# Patient Record
Sex: Female | Born: 1995 | Race: White | Hispanic: No | Marital: Single | Smoking: Current every day smoker
Health system: Southern US, Community
[De-identification: ages and names within clinical notes are randomized; demographics above are authoritative.]

## PROBLEM LIST (undated history)

## (undated) DIAGNOSIS — J45909 Unspecified asthma, uncomplicated: Secondary | ICD-10-CM

---

## 2014-03-21 ENCOUNTER — Emergency Department: Payer: Self-pay | Admitting: Emergency Medicine

## 2014-08-15 ENCOUNTER — Emergency Department: Payer: Self-pay | Admitting: Emergency Medicine

## 2014-08-19 ENCOUNTER — Emergency Department: Payer: Self-pay | Admitting: Emergency Medicine

## 2014-11-23 NOTE — L&D Delivery Note (Signed)
Late entry due to computers being down for a long time.  Delivery Note At 4:10 PM a viable female was delivered via Vaginal, Spontaneous Delivery (Presentation: Right Occiput Anterior).  APGAR: 7, 9; weight 6 lb 5.6 oz (2880 g).   Placenta status: Intact, Spontaneous.  Cord: 3 vessels with the following complications: minimal wharton's jelly, very thin cord, wrapped around neck, body and arm.   Cord pH: not collected  Anesthesia: Epidural  Episiotomy:  none Lacerations:  None - bilateral labial abrasions Suture Repair: none Est. Blood Loss (mL):  400cc  Mom to postpartum.  Baby to Couplet care / Skin to Skin.  Variable decelerations throughout active first stage, also early decels.  Epidural replaced, Patient was complete and pushing, 2nd epidural not working.  She had controlled pushing, and delivered ROA with tight nuchal not able to be reduced, with body cord, and also wrapped around arm.  Baby was untangled and placed on mom's chest and had vigorous cry after stimulation.  First Apgar was 7 due to color and tone. Cord itself was thin and largely absent of wharton's jelly.  After at least 1 minute delay, the cord was clamped and cut by FOB.  Due to size (SGA) at dates (40.3) placenta was sent to pathology.  After Sheria Lang was recovered by the pediatric staff, he was placed on mom's chest skin to skin and we sang happy birthday!  Ward, Chelsea C 07/17/2015, 5:59 PM

## 2015-07-16 ENCOUNTER — Inpatient Hospital Stay: Payer: Medicaid Other

## 2015-07-16 ENCOUNTER — Encounter: Payer: Self-pay | Admitting: *Deleted

## 2015-07-16 ENCOUNTER — Inpatient Hospital Stay
Admission: RE | Admit: 2015-07-16 | Discharge: 2015-07-19 | DRG: 775 | Disposition: A | Discharge status: Home / Self Care | Payer: Medicaid Other | Attending: Obstetrics and Gynecology | Admitting: Obstetrics and Gynecology

## 2015-07-16 DIAGNOSIS — O26843 Uterine size-date discrepancy, third trimester: Secondary | ICD-10-CM

## 2015-07-16 DIAGNOSIS — O99333 Smoking (tobacco) complicating pregnancy, third trimester: Secondary | ICD-10-CM | POA: Diagnosis present

## 2015-07-16 DIAGNOSIS — F17200 Nicotine dependence, unspecified, uncomplicated: Secondary | ICD-10-CM | POA: Diagnosis present

## 2015-07-16 DIAGNOSIS — O365930 Maternal care for other known or suspected poor fetal growth, third trimester, not applicable or unspecified: Secondary | ICD-10-CM | POA: Diagnosis present

## 2015-07-16 DIAGNOSIS — Z3A40 40 weeks gestation of pregnancy: Secondary | ICD-10-CM | POA: Diagnosis present

## 2015-07-16 DIAGNOSIS — O368390 Maternal care for abnormalities of the fetal heart rate or rhythm, unspecified trimester, not applicable or unspecified: Secondary | ICD-10-CM | POA: Diagnosis present

## 2015-07-16 HISTORY — DX: Unspecified asthma, uncomplicated: J45.909

## 2015-07-16 LAB — CBC
HCT: 35.7 % (ref 35.0–47.0)
Hemoglobin: 12 g/dL (ref 12.0–16.0)
MCH: 31.8 pg (ref 26.0–34.0)
MCHC: 33.6 g/dL (ref 32.0–36.0)
MCV: 94.7 fL (ref 80.0–100.0)
PLATELETS: 173 10*3/uL (ref 150–440)
RBC: 3.77 MIL/uL — ABNORMAL LOW (ref 3.80–5.20)
RDW: 13.1 % (ref 11.5–14.5)
WBC: 10.6 10*3/uL (ref 3.6–11.0)

## 2015-07-16 LAB — URINE DRUG SCREEN, QUALITATIVE (ARMC ONLY)
AMPHETAMINES, UR SCREEN: NOT DETECTED
Barbiturates, Ur Screen: NOT DETECTED
Benzodiazepine, Ur Scrn: NOT DETECTED
CANNABINOID 50 NG, UR ~~LOC~~: NOT DETECTED
COCAINE METABOLITE, UR ~~LOC~~: NOT DETECTED
MDMA (ECSTASY) UR SCREEN: NOT DETECTED
Methadone Scn, Ur: NOT DETECTED
OPIATE, UR SCREEN: NOT DETECTED
PHENCYCLIDINE (PCP) UR S: NOT DETECTED
Tricyclic, Ur Screen: NOT DETECTED

## 2015-07-16 LAB — TYPE AND SCREEN
ABO/RH(D): AB POS
Antibody Screen: NEGATIVE

## 2015-07-16 MED ORDER — ACETAMINOPHEN 325 MG PO TABS: 650.0000 mg | ORAL_TABLET | ORAL | Status: DC | PRN

## 2015-07-16 MED ORDER — LIDOCAINE HCL (PF) 1 % IJ SOLN
30.0000 mL | INTRAMUSCULAR | Status: DC | PRN
  Filled 2015-07-16: qty 30

## 2015-07-16 MED ORDER — CITRIC ACID-SODIUM CITRATE 334-500 MG/5ML PO SOLN: 30.0000 mL | ORAL | Status: DC | PRN

## 2015-07-16 MED ORDER — OXYTOCIN BOLUS FROM INFUSION: 500.0000 mL | INTRAVENOUS | Status: DC

## 2015-07-16 MED ORDER — ONDANSETRON HCL 4 MG/2ML IJ SOLN: 4.0000 mg | Freq: Four times a day (QID) | INTRAMUSCULAR | Status: DC | PRN

## 2015-07-16 MED ORDER — LACTATED RINGERS IV SOLN
500.0000 mL | INTRAVENOUS | Status: DC | PRN
  Administered 2015-07-16: 500 mL via INTRAVENOUS

## 2015-07-16 MED ORDER — LACTATED RINGERS IV SOLN
INTRAVENOUS | Status: DC
  Administered 2015-07-17: 11:00:00 via INTRAVENOUS

## 2015-07-16 MED ORDER — FENTANYL CITRATE (PF) 100 MCG/2ML IJ SOLN: 50.0000 ug | INTRAMUSCULAR | Status: DC | PRN

## 2015-07-16 MED ORDER — OXYTOCIN 40 UNITS IN LACTATED RINGERS INFUSION - SIMPLE MED
62.5000 mL/h | INTRAVENOUS | Status: DC
  Administered 2015-07-17: 999 mL/h via INTRAVENOUS
  Filled 2015-07-16: qty 1000

## 2015-07-16 NOTE — OB Triage Note (Signed)
Pt arrived [facility] from [facility] for extended monitoring per Dr. Tiburcio Pea order.  Pt was seen today in office and had a decel during NST per MD. MD states pt was seen as new pt in practice as she has just moved from [place].  Pt is a G1 P0 with EDC of 07/14/15 making 40/2 weeks.  Pt denies any allergies. Pt denies any fallings during pregnancy.  Pt denies any bleeding or leaking of fluid. Ellison Carwin RNC

## 2015-07-16 NOTE — Progress Notes (Signed)
OB Note Unable to get the ultrasound report from her out of state clinic prior to them closing. 130 baseline, +accels, last decel (episode vs late) and variable was at 1840 and 1825 respectively, mod variability Toco: q3-40m NAD  Plan of care d/w pt. I told her that I don't want to induce her until we get her anatomy scan from her outside [place]. Also, we'll get a growth u/s tonight and possibly take her off EFM if the fetus is consistently category I for at least 4 hours and the growth u/s isn't concerning. Pt is amenable to plan  Cornelia Copa MD [facility]  Pager: [phone]

## 2015-07-16 NOTE — H&P (Addendum)
Obstetrics Admission History & Physical  07/16/2015 - 5:40 PM Primary OBGYN: [facility]  Chief Complaint: abnormal EFM in the office  History of Present Illness  19 y.o. G1 @ 40/2 (Dating: EDC 8/21 outside records), with the above CC. Pregnancy complicated by: asthma (no meds), tobacco abuse, BMI 25  Per patient and chart, last visit at her [place] O[facility] was two months and patient moved up her to be closer to family and b/c lack of room at her prior residence. Patient has had two visits with [place] with her first visit on 8/17.  In reviewing her records, it appears that her pregnancy is uncomplicated. She did have NIPT testing done, which was negative, and per the patient, this was likely done b/c they saw a "spot on the baby's heart on ultrasound."  No labor s/s or decreased FM, LOF.  Today, she got an AFI and NST in the office because she was new to the practice and AFI 7.5 cephalic and NST with a decel (NST note not in the system but there's no toco in the office anyways). She was checked and noted to 0/10/-3.  Review of Systems: her 12 point review of systems is negative or as noted in the History of Present Illness.  PMHx: No past medical history on file. PSHx: No past surgical history on file. Medications: PNV, iron  Allergies: has No Known Allergies. OBGYN Hx: as above         FHx: No family history on file. Soc Hx: FOB involved but still lives in [place]. They are on good terms but moved out b/c need for more physical space and they weren't living together at the time.  Social History   Social History  . Marital Status: Single    Spouse Name: N/A  . Number of Children: N/A  . Years of Education: N/A   Occupational History  . Not on file.   Social History Main Topics  . Smoking status: Not on file  . Smokeless tobacco: Not on file  . Alcohol Use: Not on file  . Drug Use: Not on file  . Sexual Activity: Not on file   Other Topics Concern  . Not on file   Social History Narrative   . No narrative on file    Objective  AF vital signs normal and stable  EFM: 130 baseline, no accels, occasional slight variables and one episodic decel x15m with nadir in the 100s Toco: no definite UCs but +irritability  General: Well nourished, well developed female in no acute distress.  Skin:  Warm and dry.  Cardiovascular: Regular rate and rhythm. Respiratory:  Clear to auscultation bilateral. Normal respiratory effort Abdomen: soft, NTTP, gravid, fundal height 32 Neuro/Psych:  Normal mood and affect.   Labs  pending  Radiology pending  Perinatal info  Per records: AB pos/RI/Varicella not checked/rpr and VDRL negative/hiv neg/hepB neg/tdap needed/pap not applicable/MSAFP, cystic fibrosis 32, sickle cell screening negative/1hr GTT 108  Assessment & Plan   19 y.o. G1 @ 40/2 with category II tracing and size < dates. Pt currently stable *IUP: category II tracing but normal baseline and moderate variability. Leave on continuous EFM -No u/s are in her records that we got from [facility]. I called their offices and they are going to fax [place] the report -tdap PP -check varicella PP *S<D: no fundal heights recorded at her two PNVs. Will get growth u/s.  *IOL: Given size < dates on exam and tracing, I recommended IOL to patient which she  is amenable to. Can proceed once is back. Will consider contraction stress test *?EIF: based on patient's history; will follow up u/s records from other OB provider *GBS: neg; GC/CT neg *Analgesia: no needs *Asthma: no needs  Cornelia Copa. MD [facility] Pager [phone]

## 2015-07-17 ENCOUNTER — Encounter: Payer: Self-pay | Admitting: Anesthesiology

## 2015-07-17 ENCOUNTER — Inpatient Hospital Stay: Payer: Medicaid Other | Admitting: Anesthesiology

## 2015-07-17 LAB — ABO/RH: ABO/RH(D): AB POS

## 2015-07-17 LAB — CHLAMYDIA/NGC RT PCR (ARMC ONLY)
CHLAMYDIA TR: NOT DETECTED
N GONORRHOEAE: NOT DETECTED

## 2015-07-17 LAB — RPR: RPR Ser Ql: NONREACTIVE

## 2015-07-17 MED ORDER — EPHEDRINE 5 MG/ML INJ
10.0000 mg | INTRAVENOUS | Status: DC | PRN
  Filled 2015-07-17: qty 2

## 2015-07-17 MED ORDER — BUPIVACAINE HCL (PF) 0.25 % IJ SOLN
INTRAMUSCULAR | Status: DC | PRN
  Administered 2015-07-17: 10 mL via PERINEURAL
  Administered 2015-07-17: 3 mL
  Administered 2015-07-17: 4 mL

## 2015-07-17 MED ORDER — PHENYLEPHRINE 40 MCG/ML (10ML) SYRINGE FOR IV PUSH (FOR BLOOD PRESSURE SUPPORT)
80.0000 ug | PREFILLED_SYRINGE | INTRAVENOUS | Status: DC | PRN
  Filled 2015-07-17: qty 2

## 2015-07-17 MED ORDER — LIDOCAINE-EPINEPHRINE (PF) 1.5 %-1:200000 IJ SOLN
INTRAMUSCULAR | Status: DC | PRN
  Administered 2015-07-17: 5 mL via PERINEURAL
  Administered 2015-07-17: 2 mL via PERINEURAL
  Administered 2015-07-17: 3 mL via PERINEURAL

## 2015-07-17 MED ORDER — DOCUSATE SODIUM 100 MG PO CAPS
100.0000 mg | ORAL_CAPSULE | Freq: Two times a day (BID) | ORAL | Status: DC
  Administered 2015-07-18 – 2015-07-19 (×4): 100 mg via ORAL
  Filled 2015-07-17 (×4): qty 1

## 2015-07-17 MED ORDER — PRENATAL MULTIVITAMIN CH
1.0000 | ORAL_TABLET | Freq: Every day | ORAL | Status: DC
  Administered 2015-07-18 – 2015-07-19 (×2): 1 via ORAL
  Filled 2015-07-17 (×2): qty 1

## 2015-07-17 MED ORDER — FENTANYL 2.5 MCG/ML W/ROPIVACAINE 0.2% IN NS 100 ML EPIDURAL INFUSION (ARMC-ANES): 10.0000 mL/h | EPIDURAL | Status: DC

## 2015-07-17 MED ORDER — ONDANSETRON HCL 4 MG/2ML IJ SOLN: 4.0000 mg | INTRAMUSCULAR | Status: DC | PRN

## 2015-07-17 MED ORDER — BENZOCAINE-MENTHOL 20-0.5 % EX AERO: 1.0000 "application " | INHALATION_SPRAY | CUTANEOUS | Status: DC | PRN

## 2015-07-17 MED ORDER — LIDOCAINE HCL (PF) 1 % IJ SOLN
INTRAMUSCULAR | Status: DC | PRN
  Administered 2015-07-17: 3 mL

## 2015-07-17 MED ORDER — IBUPROFEN 600 MG PO TABS
600.0000 mg | ORAL_TABLET | Freq: Four times a day (QID) | ORAL | Status: DC
  Administered 2015-07-17 – 2015-07-19 (×7): 600 mg via ORAL
  Filled 2015-07-17 (×6): qty 1

## 2015-07-17 MED ORDER — TETANUS-DIPHTH-ACELL PERTUSSIS 5-2.5-18.5 LF-MCG/0.5 IM SUSP
0.5000 mL | Freq: Once | INTRAMUSCULAR | Status: AC
  Administered 2015-07-19: 0.5 mL via INTRAMUSCULAR
  Filled 2015-07-17: qty 0.5

## 2015-07-17 MED ORDER — IBUPROFEN 600 MG PO TABS
ORAL_TABLET | ORAL | Status: AC
  Administered 2015-07-17: 600 mg via ORAL
  Filled 2015-07-17: qty 1

## 2015-07-17 MED ORDER — FENTANYL 2.5 MCG/ML W/ROPIVACAINE 0.2% IN NS 100 ML EPIDURAL INFUSION (ARMC-ANES)
EPIDURAL | Status: AC
  Administered 2015-07-17: 9 mL/h via EPIDURAL
  Filled 2015-07-17: qty 100

## 2015-07-17 MED ORDER — DIPHENHYDRAMINE HCL 50 MG/ML IJ SOLN: 12.5000 mg | INTRAMUSCULAR | Status: DC | PRN

## 2015-07-17 MED ORDER — WITCH HAZEL-GLYCERIN EX PADS: 1.0000 "application " | MEDICATED_PAD | CUTANEOUS | Status: DC | PRN

## 2015-07-17 MED ORDER — SIMETHICONE 80 MG PO CHEW: 80.0000 mg | CHEWABLE_TABLET | ORAL | Status: DC | PRN

## 2015-07-17 MED ORDER — ONDANSETRON HCL 4 MG PO TABS
4.0000 mg | ORAL_TABLET | ORAL | Status: DC | PRN
Start: 2015-07-17 — End: 2015-07-19

## 2015-07-17 MED ORDER — ACETAMINOPHEN 325 MG PO TABS
650.0000 mg | ORAL_TABLET | ORAL | Status: DC | PRN
  Administered 2015-07-17 – 2015-07-19 (×3): 650 mg via ORAL
  Filled 2015-07-17 (×3): qty 2

## 2015-07-17 MED ORDER — DIPHENHYDRAMINE HCL 25 MG PO CAPS: 25.0000 mg | ORAL_CAPSULE | Freq: Four times a day (QID) | ORAL | Status: DC | PRN

## 2015-07-17 MED ORDER — DIBUCAINE 1 % RE OINT: 1.0000 "application " | TOPICAL_OINTMENT | RECTAL | Status: DC | PRN

## 2015-07-17 MED ORDER — LANOLIN HYDROUS EX OINT: TOPICAL_OINTMENT | CUTANEOUS | Status: DC | PRN

## 2015-07-17 NOTE — Anesthesia Procedure Notes (Addendum)
Epidural Patient location during procedure: OB Start time: 07/17/2015 11:26 AM End time: 07/17/2015 11:46 AM  Staffing Resident/CRNA: Amoni Morales  Preanesthetic Checklist Completed: patient identified, site marked, surgical consent, pre-op evaluation, timeout performed, IV checked, risks and benefits discussed and monitors and equipment checked  Epidural Patient position: sitting Prep: Betadine Patient monitoring: heart rate, continuous pulse ox and blood pressure Approach: midline Location: L4-L5 Injection technique: LOR saline  Needle:  Needle type: Tuohy  Needle gauge: 18 G Needle length: 9 cm and 9 Needle insertion depth: 6 cm Catheter type: closed end flexible Catheter size: 20 Guage Catheter at skin depth: 11 cm Test dose: negative and 1.5% lidocaine with Epi 1:200 K  Assessment Sensory level: T9 Events: blood not aspirated, injection not painful, no injection resistance, negative IV test and no paresthesia  Additional Notes   Patient tolerated the insertion well without complications.Reason for block:procedure for pain  Epidural Patient location during procedure: OB Start time: 07/17/2015 1:49 PM End time: 07/17/2015 2:08 PM  Staffing Resident/CRNA: Junious Silk Performed by: resident/CRNA   Preanesthetic Checklist Completed: patient identified, site marked, surgical consent, pre-op evaluation, timeout performed, IV checked, risks and benefits discussed and monitors and equipment checked  Epidural Patient position: sitting Prep: Betadine Patient monitoring: heart rate, continuous pulse ox and blood pressure Approach: midline Location: L2-L3 Injection technique: LOR saline  Needle:  Needle type: Tuohy  Needle gauge: 18 G Needle length: 9 cm and 9 Needle insertion depth: 6 cm Catheter type: closed end flexible Catheter size: 20 Guage Catheter at skin depth: 11 cm Test dose: negative and 1.5% lidocaine with Epi 1:200 K  Assessment Sensory level:  T10 Events: blood not aspirated, injection not painful, no injection resistance, negative IV test and no paresthesia  Additional Notes  1st epidural failed.  Removed with tip intact.  Placed new epi in.   Patient tolerated the insertion well without complications.Reason for block:procedure for pain

## 2015-07-17 NOTE — Anesthesia Preprocedure Evaluation (Addendum)
Anesthesia Evaluation  Patient identified by MRN, date of birth, ID band Patient awake    Reviewed: Allergy & Precautions, H&P , NPO status , Patient's Chart, lab work & pertinent test results, reviewed documented beta blocker date and time   Airway Mallampati: II  TM Distance: <3 FB Neck ROM: full    Dental no notable dental hx.    Pulmonary neg pulmonary ROS, asthma , Current Smoker,    Pulmonary exam normal       Cardiovascular negative cardio ROS Normal cardiovascular exam    Neuro/Psych negative neurological ROS  negative psych ROS   GI/Hepatic negative GI ROS, Neg liver ROS,   Endo/Other  negative endocrine ROS  Renal/GU negative Renal ROS  negative genitourinary   Musculoskeletal   Abdominal   Peds  Hematology negative hematology ROS (+)   Anesthesia Other Findings   Reproductive/Obstetrics (+) Pregnancy                            Anesthesia Physical Anesthesia Plan  ASA: II  Anesthesia Plan: Epidural   Post-op Pain Management:    Induction:   Airway Management Planned:   Additional Equipment:   Intra-op Plan:   Post-operative Plan:   Informed Consent: I have reviewed the patients History and Physical, chart, labs and discussed the procedure including the risks, benefits and alternatives for the proposed anesthesia with the patient or authorized representative who has indicated his/her understanding and acceptance.     Plan Discussed with: Anesthesiologist and CRNA  Anesthesia Plan Comments:        Anesthesia Quick Evaluation

## 2015-07-17 NOTE — Progress Notes (Signed)
[id number], labor, delivery and postpartum care entered late due to EPIC down time

## 2015-07-17 NOTE — Discharge Summary (Signed)
Obstetrical Discharge Summary  Date of Admission: 07/16/2015 Date of Discharge: 07/19/2015  Primary OB: in [place], NOB @ [facility] @ [redacted]wks  Gestational Age at Delivery: [redacted]w[redacted]d   Antepartum complications: late transfer of care, size < dates Reason for Admission:  Category 2 strip on NST in-office, for prolonged monitoring, decelerations. Date of Delivery:  07/17/15 Delivered By: Leeroy Bock Ward Delivery Type: spontaneous vaginal delivery Intrapartum complications/course: Patient was admitted to triage for prolonged monitoring due to decelerations seen on in-office NST.  She was observed, with occasional variables, and was measuring S<D on [place].  Ultrasound was obtained and AUA was 37 weeks, with extrapolation of EFW to be ~10th% of EGA of 40w 3days.  (~2800g).  FHT showed some decelerations, and upon AM cervical exam, was found to be 4cm.  She was AROM'd and expectantly managed.  Variable decelerations throughout active first stage, also early decels.She had controlled pushing, and delivered ROA with tight nuchal not able to be reduced, with body cord, and also wrapped around arm. Baby was untangled and placed on mom's chest and had vigorous cry after stimulation. First Apgar was 7 due to color and tone. Cord itself was thin and largely absent of wharton's jelly. After at least 1 minute delay, the cord was clamped and cut by FOB. Due to SGA, placenta was sent to pathology. Anesthesia: epidural (x2, neither worked well) Placenta: sponatneous Laceration: none, bilateral labial abrasions, no repair Episiotomy: none Newborn Data: Live born female  Birth Weight: 6 lb 5.6 oz (2880 g) APGAR: 7, 9    Discharge Physical Exam:  General: NAD CV: RRR Pulm: CTABL, nl effort ABD: s/nd/nt, fundus firm and below the umbilicus Lochia: moderate DVT Evaluation: LE non-ttp, no evidence of DVT on exam.  HEMOGLOBIN  Date Value Ref Range Status  07/18/2015 11.2* 12.0 - 16.0 g/dL Final   HCT  Date Value  Ref Range Status  07/18/2015 33.0* 35.0 - 47.0 % Final    Post partum course: routine Postpartum Procedures: none Disposition: stable, discharge to home.  Rh Immune globulin given: no Rubella vaccine given: no Tdap vaccine given in AP or PP setting: postpartum Flu vaccine given in AP or PP setting: n/a  Contraception: OCPs, start in 3-4 weeks; Rx given  Prenatal Labs:  AB pos/RI/Varicella not checked/rpr and VDRL negative/hiv neg/hepB neg/tdap needed/pap not applicable/MSAFP, cystic fibrosis 32, sickle cell screening negative/1hr GTT 108, GBS neg   Plan:  Lindsay Williamson was discharged to home in good condition. Follow-up appointment at [facility] with Dr Elesa Massed in 6 weeks   Discharge Medications:   Medication List    TAKE these medications        ferrous sulfate 325 (65 FE) MG tablet  Take 325 mg by mouth daily with breakfast.     ibuprofen 600 MG tablet  Commonly known as:  ADVIL,MOTRIN  Take 1 tablet (600 mg total) by mouth every 6 (six) hours.     PRENATAL VITAMIN PO  Take 1 tablet by mouth daily.        Signed: Annamarie Major, MD [facility] [phone]

## 2015-07-18 LAB — CBC
HCT: 33 % — ABNORMAL LOW (ref 35.0–47.0)
Hemoglobin: 11.2 g/dL — ABNORMAL LOW (ref 12.0–16.0)
MCH: 32.3 pg (ref 26.0–34.0)
MCHC: 34 g/dL (ref 32.0–36.0)
MCV: 95.2 fL (ref 80.0–100.0)
PLATELETS: 149 10*3/uL — AB (ref 150–440)
RBC: 3.46 MIL/uL — ABNORMAL LOW (ref 3.80–5.20)
RDW: 13.1 % (ref 11.5–14.5)
WBC: 15.7 10*3/uL — AB (ref 3.6–11.0)

## 2015-07-18 NOTE — Anesthesia Postprocedure Evaluation (Signed)
  Anesthesia Post-op Note  Patient: Lindsay Williamson  Procedure(s) Performed: CLE  Anesthesia type:Epidural  Patient location: 337  Post pain: Pain level controlled  Post assessment: Post-op Vital signs reviewed, Patient's Cardiovascular Status Stable, Respiratory Function Stable, Patent Airway and No signs of Nausea or vomiting  Post vital signs: Reviewed and stable  Last Vitals:  Filed Vitals:   07/18/15 0747  BP: 112/71  Pulse: 62  Temp: 36.6 C  Resp: 20    Level of consciousness: awake, alert  and patient cooperative  Complications: No apparent anesthesia complications

## 2015-07-18 NOTE — Progress Notes (Signed)
Post Partum [id number] Subjective: no complaints and tolerating PO, bottle feeding, mild cramping  Objective: Blood pressure 107/70, pulse 73, temperature 98 F (36.7 C), temperature source Oral, resp. rate 18, height  (1.626 m), weight 142 lb (64.411 kg), last menstrual period 09/27/2014, SpO2 99 %, unknown if currently breastfeeding.  Physical Exam:  General: alert, cooperative and happy Lochia: appropriate Uterine Fundus: firm at U-1/ ML/ NT DVT Evaluation: No evidence of DVT seen on physical exam.   Recent Labs  07/16/15 1737 07/18/15 0539  HGB 12.0 11.2*  HCT 35.7 33.0*  WBC 10.6 15.7*  PLT 173 149*    Assessment/Plan:  PPD #1 stable Plan for discharge tomorrow   LOS: 2 days   Lindsay Williamson 07/18/2015, 12:33 PM

## 2015-07-19 LAB — SURGICAL PATHOLOGY

## 2015-07-19 MED ORDER — IBUPROFEN 600 MG PO TABS: 600.0000 mg | ORAL_TABLET | Freq: Four times a day (QID) | ORAL | Status: AC

## 2015-07-19 NOTE — Discharge Instructions (Signed)
Discharge instructions:   Call office if you have any of the following: headache, visual changes, fever >100 F, chills, breast concerns, excessive vaginal bleeding, leg pain or redness, depression or any other concerns.   Activity: Do not lift > 10 lbs for 6 weeks.  No intercourse or tampons for 6 weeks.  No driving for 1-2 weeks.   Call your doctor for increased pain or vaginal bleeding, temperature above 100.4, depression, or concerns.  No strenuous activity or heavy lifting for 6 weeks.  No intercourse, tampons, douching, or enemas for 6 weeks.  No tub baths-showers only.  No driving for 2 weeks or while taking pain medications.  Continue prenatal vitamin and iron.  Increase calories and fluids while breastfeeding.  Please call your doctor or return to the ER if you experience any chest pains, shortness of breath, fever greater than 101, any heavy bleeding or large clots, and foul smelling vaginal discharge, any worsening abdominal pain & cramping that is not controlled by pain medication, or any signs of post partum depression.  No tampons, enemas, douches, or sexual intercourse for 6 weeks.  Also avoid tub baths, hot tubs, or swimming for 6 weeks.

## 2015-07-19 NOTE — Progress Notes (Signed)
Post Partum Day 2 Subjective: no complaints and tolerating PO, bottle feeding Objective: Blood pressure 108/67, pulse 72, temperature 98.9 F (37.2 C), temperature source Oral, resp. rate 18, height  (1.626 m), weight 64.411 kg (142 lb), last menstrual period 09/27/2014, SpO2 97 %, unknown if currently breastfeeding.  Physical Exam:  General: alert, cooperative and happy Lochia: appropriate Uterine Fundus: firm at U-1/ ML/ NT DVT Evaluation: No evidence of DVT seen on physical exam.   Recent Labs  07/16/15 1737 07/18/15 0539  HGB 12.0 11.2*  HCT 35.7 33.0*  WBC 10.6 15.7*  PLT 173 149*    Assessment/Plan:  PPD #2 stable Plan for discharge OCPs discussed TDaP PNV   LOS: 3 days   Makyah Lavigne PAUL 07/19/2015, 8:12 AM

## 2016-02-26 ENCOUNTER — Encounter: Payer: Self-pay | Admitting: *Deleted

## 2016-02-26 ENCOUNTER — Emergency Department
Admission: EM | Admit: 2016-02-26 | Discharge: 2016-02-26 | Disposition: A | Discharge status: Home / Self Care | Payer: Medicaid Other | Attending: Emergency Medicine | Admitting: Emergency Medicine

## 2016-02-26 DIAGNOSIS — Z791 Long term (current) use of non-steroidal anti-inflammatories (NSAID): Secondary | ICD-10-CM | POA: Insufficient documentation

## 2016-02-26 DIAGNOSIS — J029 Acute pharyngitis, unspecified: Secondary | ICD-10-CM | POA: Insufficient documentation

## 2016-02-26 DIAGNOSIS — F17210 Nicotine dependence, cigarettes, uncomplicated: Secondary | ICD-10-CM | POA: Insufficient documentation

## 2016-02-26 DIAGNOSIS — J45909 Unspecified asthma, uncomplicated: Secondary | ICD-10-CM | POA: Insufficient documentation

## 2016-02-26 LAB — POCT RAPID STREP A: STREPTOCOCCUS, GROUP A SCREEN (DIRECT): NEGATIVE

## 2016-02-26 MED ORDER — LIDOCAINE VISCOUS 2 % MT SOLN: 20.0000 mL | OROMUCOSAL | Status: AC | PRN

## 2016-02-26 MED ORDER — AMOXICILLIN 500 MG PO TABS: 500.0000 mg | ORAL_TABLET | Freq: Two times a day (BID) | ORAL | Status: AC

## 2016-02-26 MED ORDER — GUAIFENESIN-CODEINE 100-10 MG/5ML PO SOLN: 10.0000 mL | ORAL | Status: AC | PRN

## 2016-02-26 NOTE — ED Provider Notes (Signed)
[facility] Emergency Department Provider Note  ____________________________________________  Time seen: Approximately 7:08 AM  I have reviewed the triage vital signs and the nursing notes.   HISTORY  Chief Complaint Sore Throat    HPI Lindsay Williamson is a 20 y.o. female presents for sore throat times one week. Denies any cough. Positive for cigarette smoker. Has tried Over-the-counter medications with no relief. Describes her discomfort as 9/10. Currently works in a [employer].Denies any fever chills.    Past Medical History  Diagnosis Date  . Asthma     last used inhaler a year ago    Patient Active Problem List   Diagnosis Date Noted  . Abnormal fetal heart rate affecting pregnancy 07/16/2015    No past surgical history on file.  Current Outpatient Rx  Name  Route  Sig  Dispense  Refill  . amoxicillin (AMOXIL) 500 MG tablet   Oral   Take 1 tablet (500 mg total) by mouth 2 (two) times daily.   20 tablet   0   . ferrous sulfate 325 (65 FE) MG tablet   Oral   Take 325 mg by mouth daily with breakfast.         . guaiFENesin-codeine 100-10 MG/5ML syrup   Oral   Take 10 mLs by mouth every 4 (four) hours as needed for cough.   120 mL   0   . ibuprofen (ADVIL,MOTRIN) 600 MG tablet   Oral   Take 1 tablet (600 mg total) by mouth every 6 (six) hours.   50 tablet   0   . lidocaine (XYLOCAINE) 2 % solution   Mouth/Throat   Use as directed 20 mLs in the mouth or throat as needed for mouth pain.   100 mL   0     Allergies Review of patient's allergies indicates no known allergies.  No family history on file.  Social History Social History  Substance Use Topics  . Smoking status: Current Every Day Smoker -- 0.25 packs/day for 3 years    Types: Cigarettes  . Smokeless tobacco: None  . Alcohol Use: No    Review of Systems Constitutional: No fever/chills ENT: Positive sore throat. Cardiovascular: Denies chest  pain. Respiratory: Denies shortness of breath. Gastrointestinal: No abdominal pain.  No nausea, no vomiting.  No diarrhea.  No constipation. Genitourinary: Negative for dysuria. Musculoskeletal: Negative for back pain. Skin: Negative for rash. Neurological: Negative for headaches, focal weakness or numbness.  10-point ROS otherwise negative.  ____________________________________________   PHYSICAL EXAM:  VITAL SIGNS: ED Triage Vitals  Enc Vitals Group     BP 02/26/16 0217 105/61 mmHg     Pulse Rate 02/26/16 0217 83     Resp 02/26/16 0217 20     Temp --      Temp src --      SpO2 02/26/16 0217 98 %     Weight 02/26/16 0217 120 lb (54.432 kg)     Height 02/26/16 0217  (1.575 m)     Head Cir --      Peak Flow --      Pain Score 02/26/16 0220 9     Pain Loc --      Pain Edu? --      Excl. in GC? --     Constitutional: Alert and oriented. Well appearing and in no acute distress. Head: Atraumatic. Nose: No congestion/rhinnorhea. Mouth/Throat: Mucous membranes are moist.  Oropharynx erythematous. Neck: No stridor. No cervical adenopathy  full range of motion.   Cardiovascular: Normal rate, regular rhythm. Grossly normal heart sounds.  Good peripheral circulation. Respiratory: Normal respiratory effort.  No retractions. Lungs CTAB. Neurologic:  Normal speech and language. No gross focal neurologic deficits are appreciated. No gait instability. Skin:  Skin is warm, dry and intact. No rash noted. Psychiatric: Mood and affect are normal. Speech and behavior are normal.  ____________________________________________   LABS (all labs ordered are listed, but only abnormal results are displayed)  Labs Reviewed  CULTURE, GROUP A STREP [facility]  POCT RAPID STREP A     PROCEDURES  Procedure(s) performed: None  Critical Care performed: No  ____________________________________________   INITIAL IMPRESSION / ASSESSMENT AND PLAN / ED COURSE  Pertinent labs & imaging  results that were available during my care of the patient were reviewed by me and considered in my medical decision making (see chart for details).  Acute tonsillar pharyngitis. Rx given for amoxicillin 500 mg twice a day 10 days, viscous lidocaine. Patient follow-up with PCP or return to the ER with any worsening symptomology. In addition patient was prescription was given a LawyerTessalon Perles for cough. ____________________________________________   FINAL CLINICAL IMPRESSION(S) / ED DIAGNOSES  Final diagnoses:  Acute pharyngitis, unspecified pharyngitis type     This chart was dictated using voice recognition software/Dragon. Despite best efforts to proofread, errors can occur which can change the meaning. Any change was purely unintentional.   Evangeline Dakinharles M Beers, PA-C 02/26/16 [phone]

## 2016-02-26 NOTE — ED Notes (Signed)
Pt has a sore throat for 1 week.  No earache   No cough.  cig smoker.  Pt alert.

## 2016-02-26 NOTE — Discharge Instructions (Signed)

## 2016-02-28 LAB — CULTURE, GROUP A STREP (THRC)

## 2016-06-04 IMAGING — CR DG CHEST 2V
1 series · 2 of 2 positions shown · non-contrast
Comparison: None.

CLINICAL DATA: Cough and wheezing

EXAM:
CHEST  2 VIEW

[Series 1: w chest pa · 0.14mm/px · 2 of 2 slices shown]
[im 1/2]
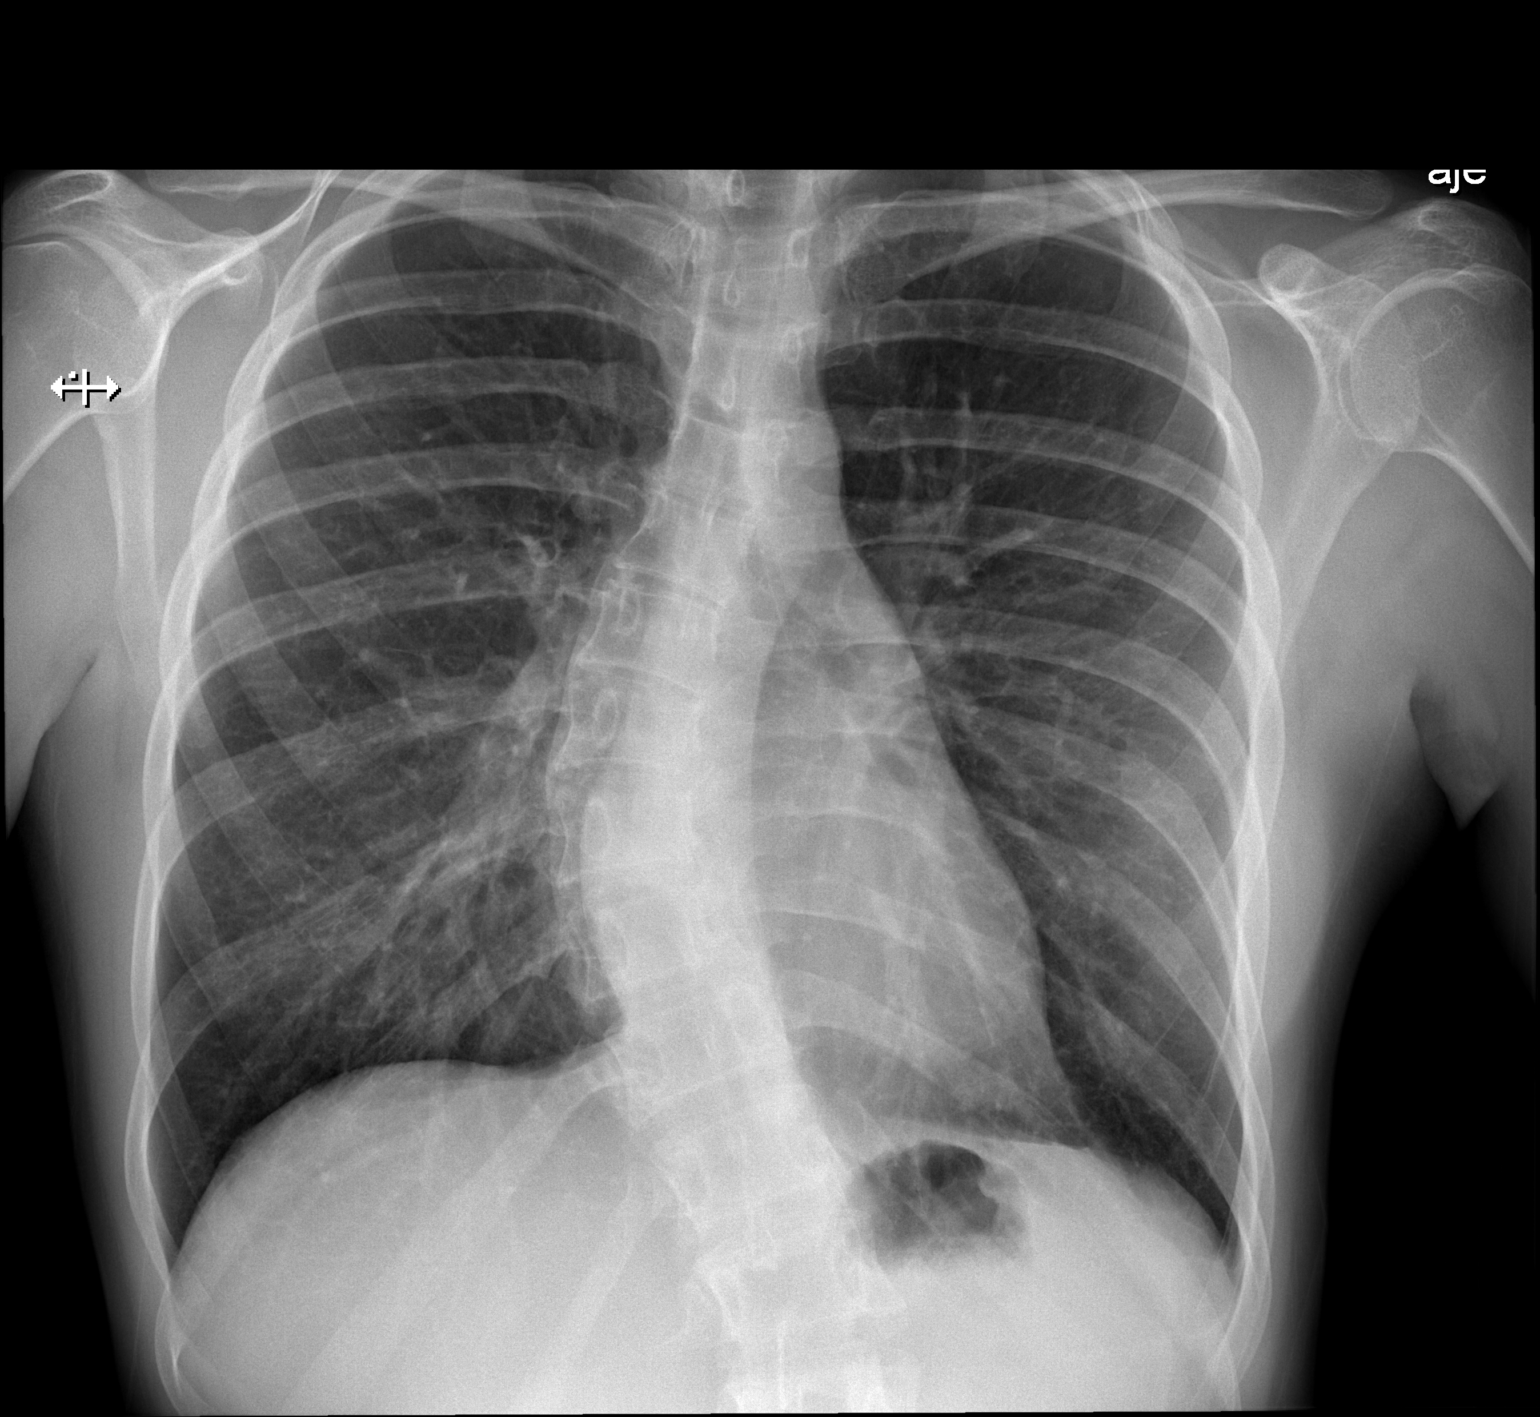
[im 2/2]
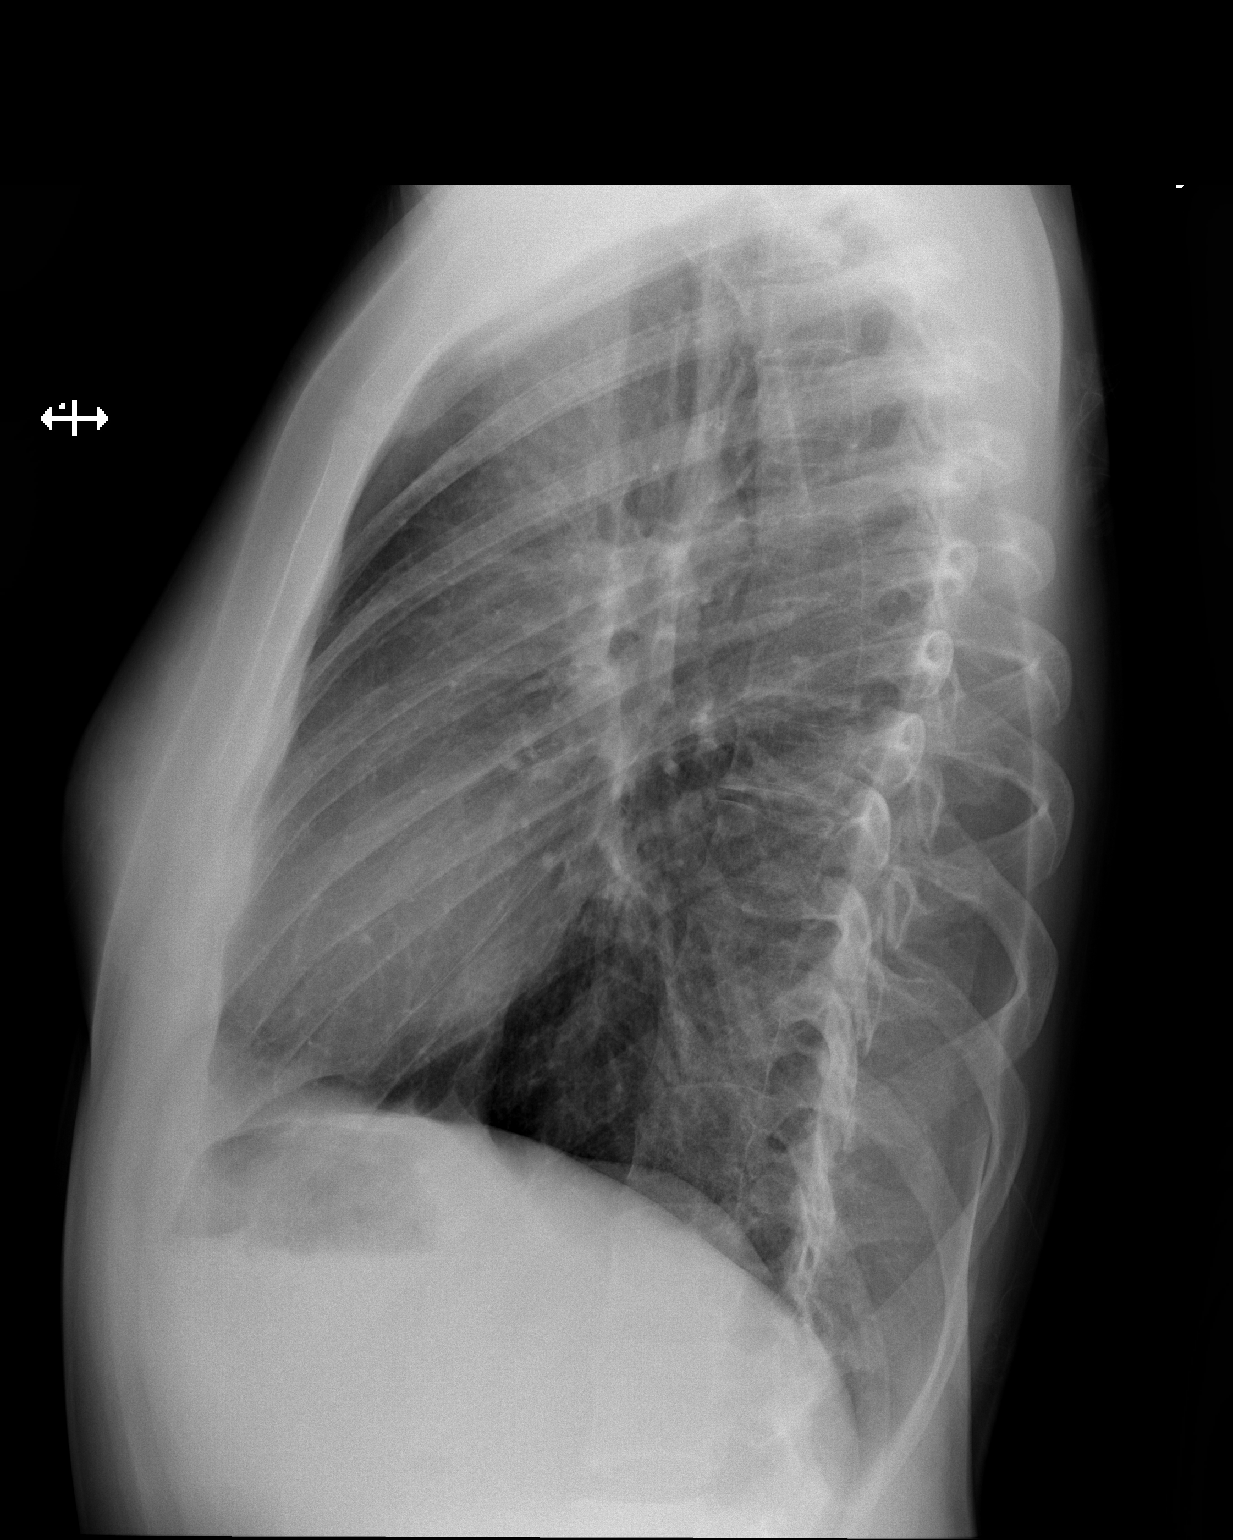

[2 of 2 positions shown; findings below may reference images not displayed]

FINDINGS: Lungs are clear. Heart size and pulmonary vascularity are normal. No
adenopathy. No pneumothorax. There is mid thoracic dextroscoliosis
with thoracolumbar levoscoliosis.
IMPRESSION: No edema or consolidation.  Scoliosis.

## 2017-03-24 IMAGING — US US OB COMP +14 WK
1 series · 13 of 28 positions shown · non-contrast
Comparison: none

CLINICAL DATA: Size less than dates, assess fetal growth. Outside
prior exams in [place], not available for comparison.

EXAM:
OBSTETRIC 14+ WK ULTRASOUND

[Series 1: us ob comp +14 wk · 0.21mm/px · 47 acquisitions, 13 frames shown]
[im 2/47]
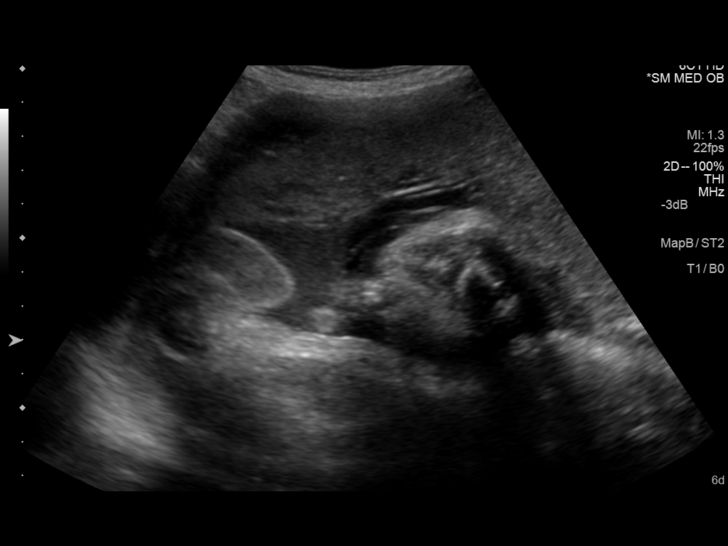
[im 6/47]
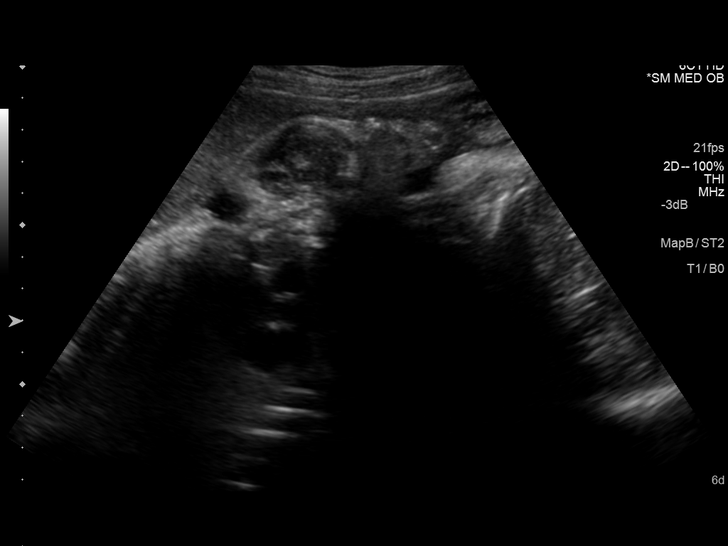
[im 9/47]
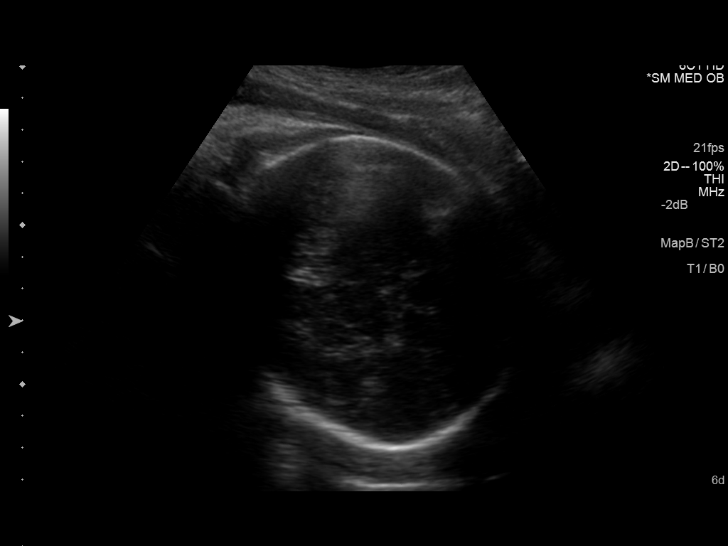
[im 12/47]
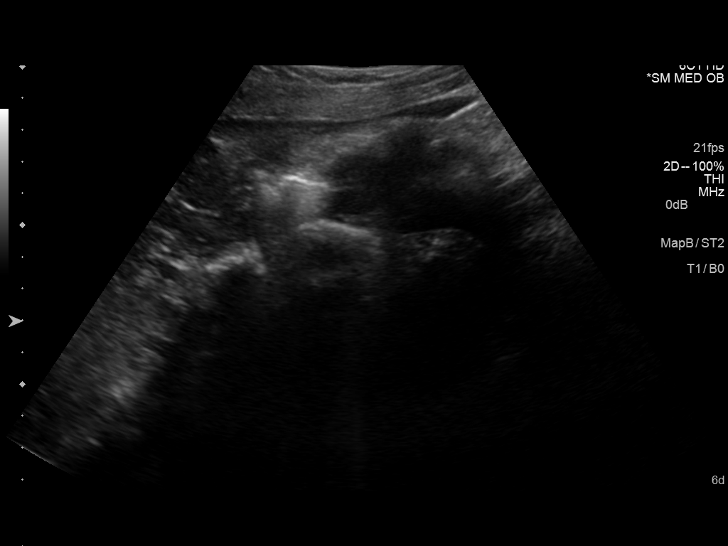
[im 16/47]
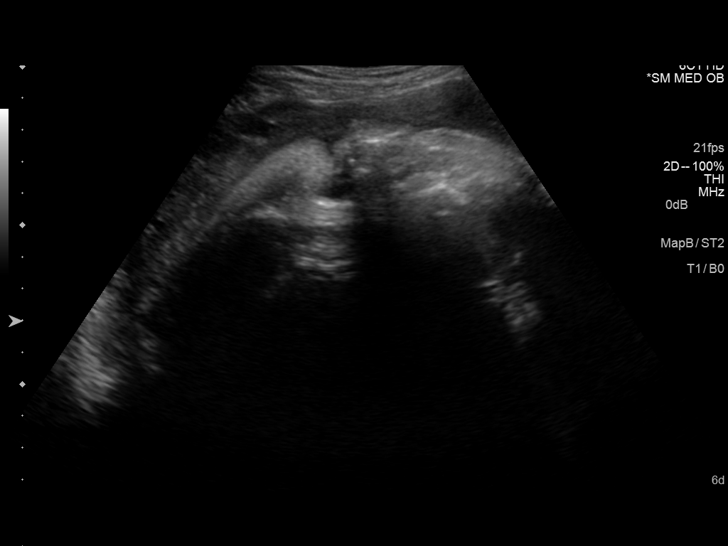
[im 19/47]
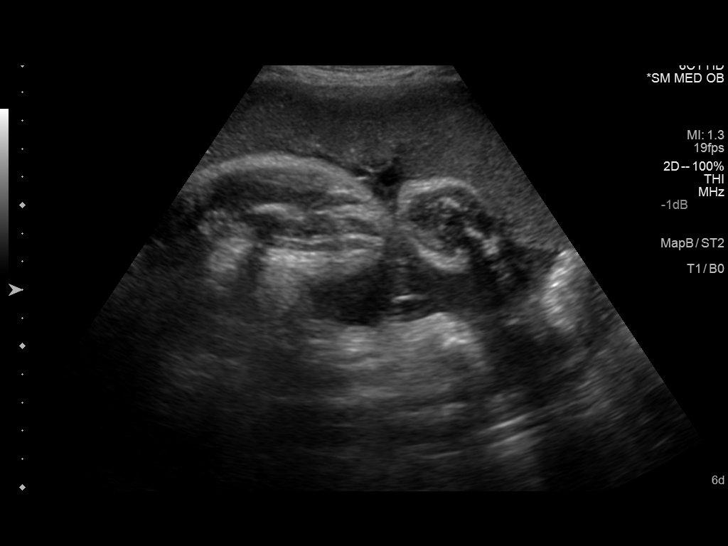
[im 24/47]
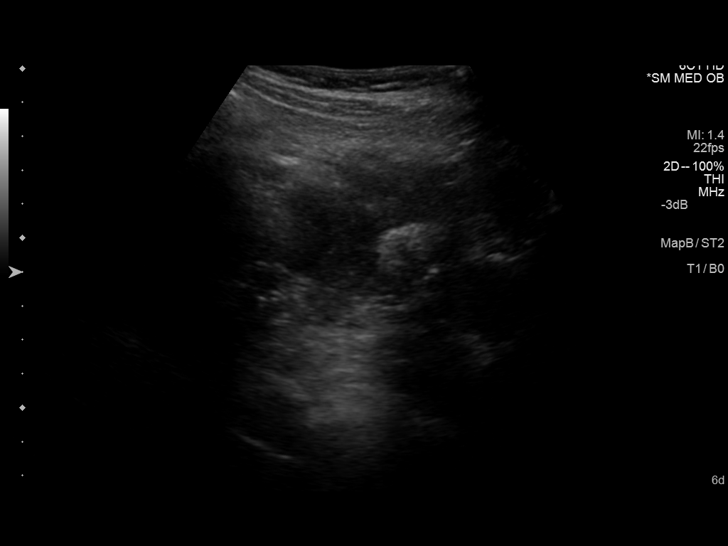
[im 28/47]
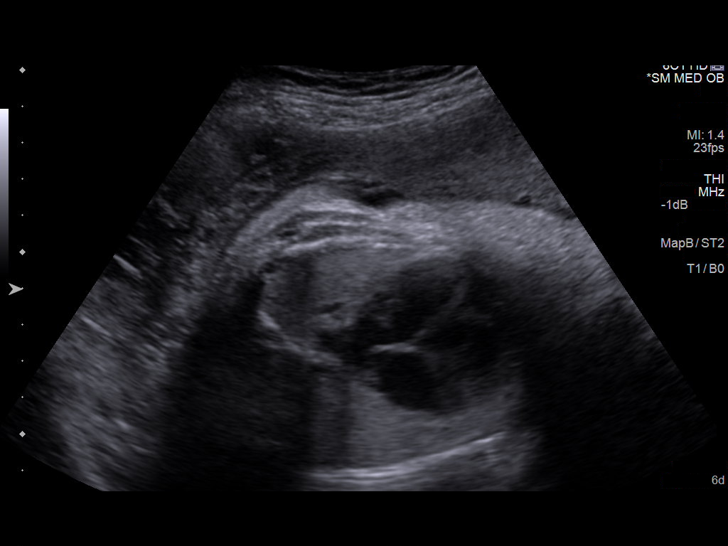
[im 31/47]
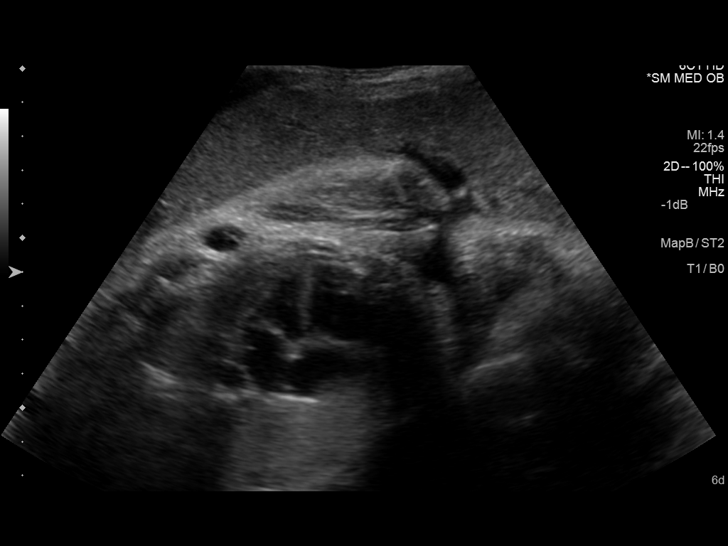
[im 35/47]
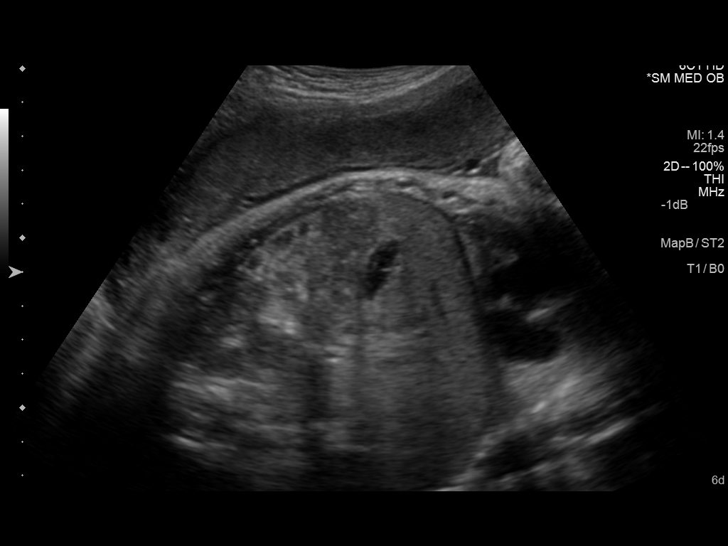
[im 38/47]
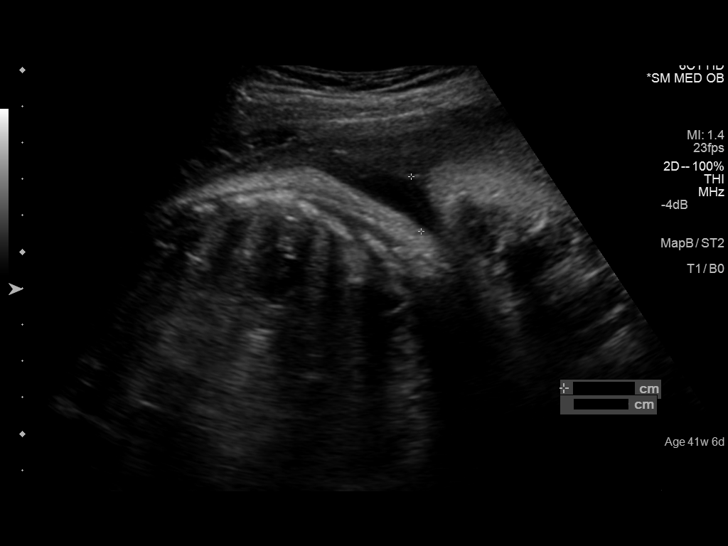
[im 41/47]
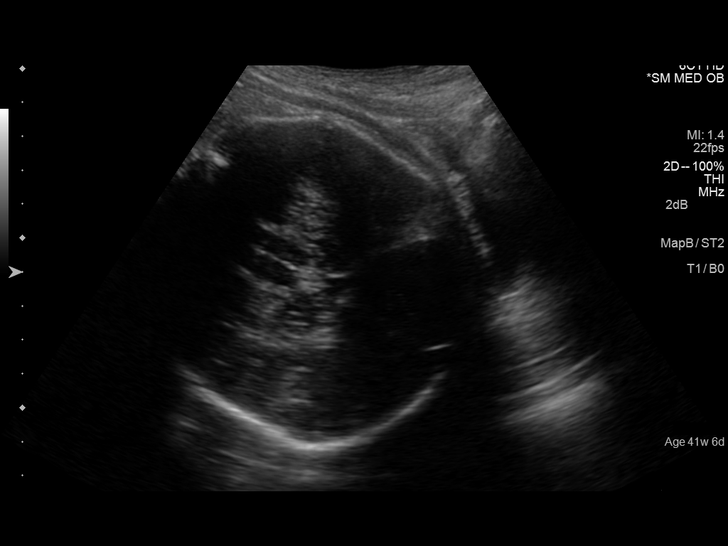
[im 45/47]
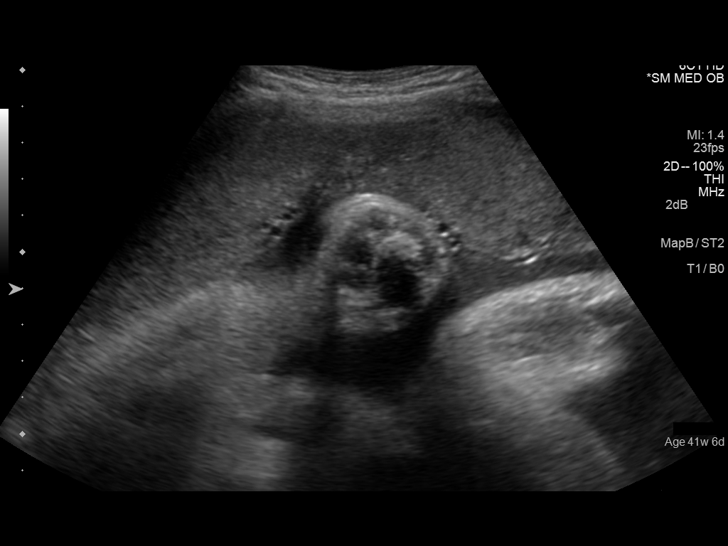

[13 of 28 positions shown; findings below may reference images not displayed]

FINDINGS: Number of Fetuses: 1

Heart Rate:  126 bpm

Movement: Yes

Presentation: Cephalic

Previa: No

Placental Location: Anterior

Amniotic Fluid (Subjective): Normal

Amniotic Fluid (Objective):  AFI 11.5 cm

FETAL BIOMETRY

BPD:  9.24cm 37w 4d

HC:    33.45cm  38w   2d

AC:   33.31cm  37w   2d

FL:   7.48cm  38w   2d

Current Mean GA: 37w 6d              US EDC: 07/31/15

FETAL ANATOMY

Lateral Ventricles:  Not visualized

Thalami/CSP:  Not visualized

Posterior Fossa:  Not visualized

Nuchal Region:  Not visualized

Upper Lip:  Not visualized

Spine:  Not visualized

4 Chamber Heart on Left: Visualized

LVOT:  Not visualized

RVOT:  Not visualized

Stomach on Left:  Visualized

3 Vessel Cord:  Not visualized

Cord Insertion site:  Not visualized

Kidneys:  Not visualized

Bladder: Visualized

Extremities:  Not visualized

Sex:  Not visualized

Technically difficult due to:  Advanced gestational age.

Maternal Findings:

Cervix:  Not visualized (advanced gestational age > 34 weeks).
IMPRESSION: Single live intrauterine gestation in cephalic presentation
measuring 37 weeks 6 days by today's biometry. No late developing
fetal anatomic abnormality in listed structures above. This is not a
comprehensive fetal survey.
